# Patient Record
Sex: Male | Born: 1970 | Race: White | Marital: Married | Smoking: Never smoker
Health system: Southern US, Community
[De-identification: ages and names within clinical notes are randomized; demographics above are authoritative.]

## PROBLEM LIST (undated history)

## (undated) DIAGNOSIS — C439 Malignant melanoma of skin, unspecified: Secondary | ICD-10-CM

## (undated) DIAGNOSIS — R519 Headache, unspecified: Secondary | ICD-10-CM

## (undated) DIAGNOSIS — Z973 Presence of spectacles and contact lenses: Secondary | ICD-10-CM

## (undated) DIAGNOSIS — K219 Gastro-esophageal reflux disease without esophagitis: Secondary | ICD-10-CM

## (undated) DIAGNOSIS — Z87442 Personal history of urinary calculi: Secondary | ICD-10-CM

## (undated) DIAGNOSIS — J45909 Unspecified asthma, uncomplicated: Secondary | ICD-10-CM

## (undated) DIAGNOSIS — F419 Anxiety disorder, unspecified: Secondary | ICD-10-CM

## (undated) DIAGNOSIS — J189 Pneumonia, unspecified organism: Secondary | ICD-10-CM

## (undated) DIAGNOSIS — Z889 Allergy status to unspecified drugs, medicaments and biological substances status: Secondary | ICD-10-CM

## (undated) DIAGNOSIS — R51 Headache: Secondary | ICD-10-CM

## (undated) HISTORY — PX: ROTATOR CUFF REPAIR: SHX139

## (undated) HISTORY — PX: HERNIA REPAIR: SHX51

## (undated) HISTORY — PX: KNEE ARTHROSCOPY: SHX127

---

## 2015-10-15 ENCOUNTER — Other Ambulatory Visit: Payer: Self-pay | Admitting: Orthopaedic Surgery

## 2015-10-15 DIAGNOSIS — M25562 Pain in left knee: Secondary | ICD-10-CM

## 2015-10-22 ENCOUNTER — Ambulatory Visit
Admission: RE | Admit: 2015-10-22 | Discharge: 2015-10-22 | Disposition: A | Discharge status: Home / Self Care | Payer: BLUE CROSS/BLUE SHIELD | Source: Ambulatory Visit | Attending: Orthopaedic Surgery | Admitting: Orthopaedic Surgery

## 2015-10-22 DIAGNOSIS — M25562 Pain in left knee: Secondary | ICD-10-CM

## 2015-11-21 ENCOUNTER — Other Ambulatory Visit: Payer: Self-pay | Admitting: Orthopedic Surgery

## 2015-12-03 ENCOUNTER — Encounter (HOSPITAL_BASED_OUTPATIENT_CLINIC_OR_DEPARTMENT_OTHER): Payer: Self-pay | Admitting: *Deleted

## 2015-12-10 ENCOUNTER — Ambulatory Visit (HOSPITAL_BASED_OUTPATIENT_CLINIC_OR_DEPARTMENT_OTHER): Payer: BLUE CROSS/BLUE SHIELD | Admitting: Anesthesiology

## 2015-12-10 ENCOUNTER — Encounter (HOSPITAL_BASED_OUTPATIENT_CLINIC_OR_DEPARTMENT_OTHER): Payer: Self-pay | Admitting: Anesthesiology

## 2015-12-10 ENCOUNTER — Encounter (HOSPITAL_BASED_OUTPATIENT_CLINIC_OR_DEPARTMENT_OTHER)
Admission: RE | Disposition: A | Discharge status: Home / Self Care | Payer: Self-pay | Source: Ambulatory Visit | Attending: Orthopedic Surgery

## 2015-12-10 ENCOUNTER — Ambulatory Visit (HOSPITAL_BASED_OUTPATIENT_CLINIC_OR_DEPARTMENT_OTHER)
Admission: RE | Admit: 2015-12-10 | Discharge: 2015-12-10 | Disposition: A | Discharge status: Home / Self Care | Payer: BLUE CROSS/BLUE SHIELD | Source: Ambulatory Visit | Attending: Orthopedic Surgery | Admitting: Orthopedic Surgery

## 2015-12-10 DIAGNOSIS — J45909 Unspecified asthma, uncomplicated: Secondary | ICD-10-CM | POA: Diagnosis not present

## 2015-12-10 DIAGNOSIS — F419 Anxiety disorder, unspecified: Secondary | ICD-10-CM | POA: Insufficient documentation

## 2015-12-10 DIAGNOSIS — S43431A Superior glenoid labrum lesion of right shoulder, initial encounter: Secondary | ICD-10-CM | POA: Diagnosis not present

## 2015-12-10 DIAGNOSIS — X58XXXA Exposure to other specified factors, initial encounter: Secondary | ICD-10-CM | POA: Insufficient documentation

## 2015-12-10 DIAGNOSIS — M75111 Incomplete rotator cuff tear or rupture of right shoulder, not specified as traumatic: Secondary | ICD-10-CM | POA: Diagnosis not present

## 2015-12-10 HISTORY — DX: Unspecified asthma, uncomplicated: J45.909

## 2015-12-10 HISTORY — DX: Anxiety disorder, unspecified: F41.9

## 2015-12-10 HISTORY — PX: SHOULDER ARTHROSCOPY WITH SUBACROMIAL DECOMPRESSION: SHX5684

## 2015-12-10 SURGERY — SHOULDER ARTHROSCOPY WITH SUBACROMIAL DECOMPRESSION
Anesthesia: General | Site: Shoulder | Laterality: Right

## 2015-12-10 MED ORDER — DOCUSATE SODIUM 100 MG PO CAPS: 100.0000 mg | ORAL_CAPSULE | Freq: Three times a day (TID) | ORAL | 0 refills | Status: AC | PRN

## 2015-12-10 MED ORDER — OXYCODONE-ACETAMINOPHEN 5-325 MG PO TABS: 1.0000 | ORAL_TABLET | ORAL | 0 refills | Status: AC | PRN

## 2015-12-10 MED ORDER — SCOPOLAMINE 1 MG/3DAYS TD PT72: 1.0000 | MEDICATED_PATCH | Freq: Once | TRANSDERMAL | Status: DC | PRN

## 2015-12-10 MED ORDER — DEXAMETHASONE SODIUM PHOSPHATE 10 MG/ML IJ SOLN
INTRAMUSCULAR | Status: AC
  Filled 2015-12-10: qty 1

## 2015-12-10 MED ORDER — PROPOFOL 10 MG/ML IV BOLUS
INTRAVENOUS | Status: DC | PRN
  Administered 2015-12-10: 200 mg via INTRAVENOUS

## 2015-12-10 MED ORDER — GLYCOPYRROLATE 0.2 MG/ML IJ SOLN: 0.2000 mg | Freq: Once | INTRAMUSCULAR | Status: DC | PRN

## 2015-12-10 MED ORDER — BUPIVACAINE-EPINEPHRINE (PF) 0.5% -1:200000 IJ SOLN
INTRAMUSCULAR | Status: DC | PRN
  Administered 2015-12-10: 25 mL via PERINEURAL

## 2015-12-10 MED ORDER — FENTANYL CITRATE (PF) 100 MCG/2ML IJ SOLN
50.0000 ug | INTRAMUSCULAR | Status: DC | PRN
  Administered 2015-12-10: 100 ug via INTRAVENOUS
  Administered 2015-12-10: 50 ug via INTRAVENOUS

## 2015-12-10 MED ORDER — ONDANSETRON HCL 4 MG/2ML IJ SOLN
INTRAMUSCULAR | Status: AC
  Filled 2015-12-10: qty 2

## 2015-12-10 MED ORDER — BUPIVACAINE HCL (PF) 0.5 % IJ SOLN
INTRAMUSCULAR | Status: AC
  Filled 2015-12-10: qty 30

## 2015-12-10 MED ORDER — SUCCINYLCHOLINE CHLORIDE 20 MG/ML IJ SOLN
INTRAMUSCULAR | Status: DC | PRN
  Administered 2015-12-10: 100 mg via INTRAVENOUS

## 2015-12-10 MED ORDER — LACTATED RINGERS IV SOLN
INTRAVENOUS | Status: DC
  Administered 2015-12-10: 10:00:00 via INTRAVENOUS

## 2015-12-10 MED ORDER — DEXAMETHASONE SODIUM PHOSPHATE 4 MG/ML IJ SOLN
INTRAMUSCULAR | Status: DC | PRN
Start: 2015-12-10 — End: 2015-12-10
  Administered 2015-12-10: 10 mg via INTRAVENOUS

## 2015-12-10 MED ORDER — FENTANYL CITRATE (PF) 100 MCG/2ML IJ SOLN
INTRAMUSCULAR | Status: AC
  Filled 2015-12-10: qty 2

## 2015-12-10 MED ORDER — MIDAZOLAM HCL 2 MG/2ML IJ SOLN
1.0000 mg | INTRAMUSCULAR | Status: DC | PRN
  Administered 2015-12-10: 2 mg via INTRAVENOUS

## 2015-12-10 MED ORDER — SUCCINYLCHOLINE CHLORIDE 200 MG/10ML IV SOSY
PREFILLED_SYRINGE | INTRAVENOUS | Status: AC
  Filled 2015-12-10: qty 10

## 2015-12-10 MED ORDER — LIDOCAINE HCL (CARDIAC) 20 MG/ML IV SOLN
INTRAVENOUS | Status: DC | PRN
  Administered 2015-12-10: 50 mg via INTRAVENOUS

## 2015-12-10 MED ORDER — POVIDONE-IODINE 7.5 % EX SOLN: Freq: Once | CUTANEOUS | Status: DC

## 2015-12-10 MED ORDER — CEFAZOLIN SODIUM-DEXTROSE 2-4 GM/100ML-% IV SOLN
2.0000 g | INTRAVENOUS | Status: AC
  Administered 2015-12-10: 2 g via INTRAVENOUS

## 2015-12-10 MED ORDER — CEFAZOLIN SODIUM-DEXTROSE 2-4 GM/100ML-% IV SOLN
INTRAVENOUS | Status: AC
  Filled 2015-12-10: qty 100

## 2015-12-10 MED ORDER — LIDOCAINE 2% (20 MG/ML) 5 ML SYRINGE
INTRAMUSCULAR | Status: AC
  Filled 2015-12-10: qty 5

## 2015-12-10 MED ORDER — MIDAZOLAM HCL 2 MG/2ML IJ SOLN
INTRAMUSCULAR | Status: AC
  Filled 2015-12-10: qty 2

## 2015-12-10 MED ORDER — SODIUM CHLORIDE 0.9 % IR SOLN
Status: DC | PRN
  Administered 2015-12-10: 6000 mL

## 2015-12-10 SURGICAL SUPPLY — 84 items
BENZOIN TINCTURE PRP APPL 2/3 (GAUZE/BANDAGES/DRESSINGS) ×2 IMPLANT
BLADE CLIPPER SURG (BLADE) IMPLANT
BLADE SURG 15 STRL LF DISP TIS (BLADE) IMPLANT
BLADE SURG 15 STRL SS (BLADE)
BLADE VORTEX 6.0 (BLADE) IMPLANT
BUR OVAL 4.0 (BURR) ×2 IMPLANT
CANNULA 5.75X71 LONG (CANNULA) ×2 IMPLANT
CANNULA TWIST IN 8.25X7CM (CANNULA) IMPLANT
CHLORAPREP W/TINT 26ML (MISCELLANEOUS) ×2 IMPLANT
CLSR STERI-STRIP ANTIMIC 1/2X4 (GAUZE/BANDAGES/DRESSINGS) ×2 IMPLANT
DECANTER SPIKE VIAL GLASS SM (MISCELLANEOUS) IMPLANT
DERMABOND ADVANCED (GAUZE/BANDAGES/DRESSINGS) ×1
DERMABOND ADVANCED .7 DNX12 (GAUZE/BANDAGES/DRESSINGS) ×1 IMPLANT
DRAPE IMP U-DRAPE 54X76 (DRAPES) ×2 IMPLANT
DRAPE INCISE IOBAN 66X45 STRL (DRAPES) ×2 IMPLANT
DRAPE STERI 35X30 U-POUCH (DRAPES) ×2 IMPLANT
DRAPE SURG 17X23 STRL (DRAPES) ×2 IMPLANT
DRAPE U-SHAPE 47X51 STRL (DRAPES) ×2 IMPLANT
DRAPE U-SHAPE 76X120 STRL (DRAPES) ×4 IMPLANT
DRSG OPSITE 6X11 MED (GAUZE/BANDAGES/DRESSINGS) ×2 IMPLANT
DRSG PAD ABDOMINAL 8X10 ST (GAUZE/BANDAGES/DRESSINGS) ×2 IMPLANT
ELECT BLADE 4.0 EZ CLEAN MEGAD (MISCELLANEOUS)
ELECT REM PT RETURN 9FT ADLT (ELECTROSURGICAL) ×2
ELECTRODE BLDE 4.0 EZ CLN MEGD (MISCELLANEOUS) IMPLANT
ELECTRODE REM PT RTRN 9FT ADLT (ELECTROSURGICAL) ×1 IMPLANT
GAUZE SPONGE 4X4 12PLY STRL (GAUZE/BANDAGES/DRESSINGS) ×2 IMPLANT
GAUZE SPONGE 4X4 16PLY XRAY LF (GAUZE/BANDAGES/DRESSINGS) ×2 IMPLANT
GAUZE XEROFORM 1X8 LF (GAUZE/BANDAGES/DRESSINGS) ×2 IMPLANT
GLOVE BIO SURGEON STRL SZ7 (GLOVE) ×2 IMPLANT
GLOVE BIO SURGEON STRL SZ7.5 (GLOVE) ×2 IMPLANT
GLOVE BIOGEL PI IND STRL 7.0 (GLOVE) ×1 IMPLANT
GLOVE BIOGEL PI IND STRL 8 (GLOVE) ×1 IMPLANT
GLOVE BIOGEL PI INDICATOR 7.0 (GLOVE) ×1
GLOVE BIOGEL PI INDICATOR 8 (GLOVE) ×1
GOWN STRL REUS W/ TWL LRG LVL3 (GOWN DISPOSABLE) ×2 IMPLANT
GOWN STRL REUS W/TWL LRG LVL3 (GOWN DISPOSABLE) ×2
GOWN STRL REUS W/TWL XL LVL3 (GOWN DISPOSABLE) ×2 IMPLANT
LASSO CRESCENT QUICKPASS (SUTURE) IMPLANT
MANIFOLD NEPTUNE II (INSTRUMENTS) ×2 IMPLANT
NDL SUT 6 .5 CRC .975X.05 MAYO (NEEDLE) IMPLANT
NEEDLE 1/2 CIR CATGUT .05X1.09 (NEEDLE) IMPLANT
NEEDLE MAYO TAPER (NEEDLE)
NEEDLE SCORPION MULTI FIRE (NEEDLE) IMPLANT
NS IRRIG 1000ML POUR BTL (IV SOLUTION) IMPLANT
PACK ARTHROSCOPY DSU (CUSTOM PROCEDURE TRAY) ×2 IMPLANT
PACK BASIN DAY SURGERY FS (CUSTOM PROCEDURE TRAY) ×2 IMPLANT
PENCIL BUTTON HOLSTER BLD 10FT (ELECTRODE) IMPLANT
PROBE BIPOLAR ATHRO 135MM 90D (MISCELLANEOUS) ×2 IMPLANT
RESECTOR FULL RADIUS 4.2MM (BLADE) ×2 IMPLANT
SLEEVE SCD COMPRESS KNEE MED (MISCELLANEOUS) ×2 IMPLANT
SLING ARM FOAM STRAP LRG (SOFTGOODS) ×2 IMPLANT
SLING ARM IMMOBILIZER MED (SOFTGOODS) IMPLANT
SLING ARM MED ADULT FOAM STRAP (SOFTGOODS) IMPLANT
SLING ARM XL FOAM STRAP (SOFTGOODS) IMPLANT
SPONGE LAP 4X18 X RAY DECT (DISPOSABLE) IMPLANT
STRIP CLOSURE SKIN 1/2X4 (GAUZE/BANDAGES/DRESSINGS) ×2 IMPLANT
SUCTION FRAZIER HANDLE 10FR (MISCELLANEOUS)
SUCTION TUBE FRAZIER 10FR DISP (MISCELLANEOUS) IMPLANT
SUPPORT WRAP ARM LG (MISCELLANEOUS) IMPLANT
SUT 2 FIBERLOOP 20 STRT BLUE (SUTURE)
SUT BONE WAX W31G (SUTURE) IMPLANT
SUT ETHIBOND 2 OS 4 DA (SUTURE) IMPLANT
SUT ETHILON 3 0 PS 1 (SUTURE) ×2 IMPLANT
SUT ETHILON 4 0 PS 2 18 (SUTURE) IMPLANT
SUT FIBERWIRE #2 38 T-5 BLUE (SUTURE)
SUT MNCRL AB 3-0 PS2 18 (SUTURE) IMPLANT
SUT MNCRL AB 4-0 PS2 18 (SUTURE) IMPLANT
SUT PDS AB 0 CT 36 (SUTURE) IMPLANT
SUT PROLENE 3 0 PS 2 (SUTURE) IMPLANT
SUT TIGER TAPE 7 IN WHITE (SUTURE) IMPLANT
SUT VIC AB 0 CT1 27 (SUTURE)
SUT VIC AB 0 CT1 27XBRD ANBCTR (SUTURE) IMPLANT
SUT VIC AB 2-0 SH 27 (SUTURE)
SUT VIC AB 2-0 SH 27XBRD (SUTURE) IMPLANT
SUTURE 2 FIBERLOOP 20 STRT BLU (SUTURE) IMPLANT
SUTURE FIBERWR #2 38 T-5 BLUE (SUTURE) IMPLANT
SYR BULB 3OZ (MISCELLANEOUS) IMPLANT
TAPE FIBER 2MM 7IN #2 BLUE (SUTURE) IMPLANT
TOWEL OR 17X24 6PK STRL BLUE (TOWEL DISPOSABLE) ×2 IMPLANT
TOWEL OR NON WOVEN STRL DISP B (DISPOSABLE) ×2 IMPLANT
TUBE CONNECTING 20X1/4 (TUBING) ×2 IMPLANT
TUBING ARTHROSCOPY IRRIG 16FT (MISCELLANEOUS) ×2 IMPLANT
WATER STERILE IRR 1000ML POUR (IV SOLUTION) ×2 IMPLANT
YANKAUER SUCT BULB TIP NO VENT (SUCTIONS) IMPLANT

## 2015-12-10 NOTE — Transfer of Care (Signed)
Immediate Anesthesia Transfer of Care Note  Patient: Jerry Ayala  Procedure(s) Performed: Procedure(s) with comments: SHOULDER ARTHROSCOPY ROTATOR CUFF DEBRIDEMENT VS REPAIR WITH SUBACROMIAL DECOMPRESSION, DISTAL CLAVICAL RESECTION (Right) - SHOULDER ARTHROSCOPY ROTATOR CUFF DEBRIDEMENT VS REPAIR WITH SUBACROMIAL DECOMPRESSION, DISTAL CLAVICAL EXCISION, POSSIBLE BICEPS TENODESIS  Patient Location: PACU  Anesthesia Type:General  Level of Consciousness: awake and sedated  Airway & Oxygen Therapy: Patient Spontanous Breathing and Patient connected to face mask oxygen  Post-op Assessment: Report given to RN and Post -op Vital signs reviewed and stable  Post vital signs: Reviewed and stable  Last Vitals:  Vitals:   12/10/15 1304 12/10/15 1305  BP:  131/81  Pulse: (!) 112 (!) 106  Resp:  16  Temp:      Last Pain:  Vitals:   12/10/15 0924  TempSrc: Oral  PainSc: 1       Patients Stated Pain Goal: 1 ([id number])  Complications: No apparent anesthesia complications

## 2015-12-10 NOTE — Anesthesia Procedure Notes (Signed)
Procedure Name: Intubation Performed by: Terrance Mass Pre-anesthesia Checklist: Patient identified, Emergency Drugs available, Suction available and Patient being monitored Patient Re-evaluated:Patient Re-evaluated prior to inductionOxygen Delivery Method: Circle system utilized Preoxygenation: Pre-oxygenation with 100% oxygen Intubation Type: IV induction Ventilation: Mask ventilation without difficulty Laryngoscope Size: Miller and 2 Grade View: Grade I Tube type: Oral Tube size: 7.0 mm Number of attempts: 1 Airway Equipment and Method: Stylet and Oral airway Placement Confirmation: ETT inserted through vocal cords under direct vision,  positive ETCO2 and breath sounds checked- equal and bilateral Secured at: 22 cm Tube secured with: Tape Dental Injury: Teeth and Oropharynx as per pre-operative assessment

## 2015-12-10 NOTE — H&P (Signed)
Jerry Ayala is an 45 y.o. male.   Chief Complaint: R shoulder pain HPI: R shoulder pain x 18 mo, failed extensive conservative management, injections, therapy, NSAIDs.   Past Medical History:  Diagnosis Date  . Anxiety   . Asthma     Past Surgical History:  Procedure Laterality Date  . KNEE ARTHROSCOPY    . ROTATOR CUFF REPAIR      History reviewed. No pertinent family history. Social History:  reports that he has never smoked. He has never used smokeless tobacco. He reports that he does not drink alcohol or use drugs.  Allergies: No Known Allergies  Medications Prior to Admission  Medication Sig Dispense Refill  . albuterol-ipratropium (COMBIVENT) 18-103 MCG/ACT inhaler Inhale 1 puff into the lungs every 4 (four) hours.    . busPIRone (BUSPAR) 5 MG tablet Take 5 mg by mouth 3 (three) times daily.    Marland Kitchen omeprazole (PRILOSEC) 20 MG capsule Take 20 mg by mouth daily.    . Triamcinolone Acetonide (NASACORT ALLERGY 24HR NA) Place into the nose.      No results found for this or any previous visit (from the past 48 hour(s)). No results found.  Review of Systems  All other systems reviewed and are negative.   Blood pressure 134/82, pulse 80, temperature 97.8 F (36.6 C), temperature source Oral, resp. rate (!) 9, height 5\' 10"  (1.778 m), weight 89.8 kg (198 lb), SpO2 100 %. Physical Exam  Constitutional: He is oriented to person, place, and time. He appears well-developed and well-nourished.  HENT:  Head: Atraumatic.  Eyes: EOM are normal.  Cardiovascular: Intact distal pulses.   Respiratory: Effort normal.  Musculoskeletal:  R shoulder pain with RC testing. TTP at [facility] joint.  Neurological: He is alert and oriented to person, place, and time.  Skin: Skin is warm and dry.  Psychiatric: He has a normal mood and affect.     Assessment/Plan R shoulder pain x 18 mo, failed extensive conservative management, injections, therapy, NSAIDs.  Plan R arth RC debride vs repair, SAD,  DCE Risks / benefits of surgery discussed Consent on chart  NPO for OR Preop antibiotics   Nita Sells, MD 12/10/2015, 11:25 AM

## 2015-12-10 NOTE — Anesthesia Preprocedure Evaluation (Signed)
Anesthesia Evaluation  Patient identified by MRN, date of birth, ID band Patient awake    Reviewed: Allergy & Precautions, NPO status , Patient's Chart, lab work & pertinent test results  Airway Mallampati: I  TM Distance: >3 FB Neck ROM: Full    Dental  (+) Teeth Intact, Dental Advisory Given   Pulmonary asthma ,    breath sounds clear to auscultation       Cardiovascular  Rhythm:Regular Rate:Normal     Neuro/Psych    GI/Hepatic   Endo/Other    Renal/GU      Musculoskeletal   Abdominal   Peds  Hematology   Anesthesia Other Findings   Reproductive/Obstetrics                             Anesthesia Physical Anesthesia Plan  ASA: II  Anesthesia Plan: General   Post-op Pain Management: GA combined w/ Regional for post-op pain   Induction: Intravenous  Airway Management Planned: Oral ETT  Additional Equipment:   Intra-op Plan:   Post-operative Plan: Extubation in OR  Informed Consent: I have reviewed the patients History and Physical, chart, labs and discussed the procedure including the risks, benefits and alternatives for the proposed anesthesia with the patient or authorized representative who has indicated his/her understanding and acceptance.   Dental advisory given  Plan Discussed with: CRNA, Anesthesiologist and Surgeon  Anesthesia Plan Comments:         Anesthesia Quick Evaluation

## 2015-12-10 NOTE — Anesthesia Postprocedure Evaluation (Signed)
Anesthesia Post Note  Patient: Jerry Ayala  Procedure(s) Performed: Procedure(s) (LRB): SHOULDER ARTHROSCOPY ROTATOR CUFF DEBRIDEMENT VS REPAIR WITH SUBACROMIAL DECOMPRESSION, DISTAL CLAVICAL RESECTION (Right)  Patient location during evaluation: PACU Anesthesia Type: General Level of consciousness: awake and alert Pain management: pain level controlled Vital Signs Assessment: post-procedure vital signs reviewed and stable Respiratory status: spontaneous breathing, nonlabored ventilation and respiratory function stable Cardiovascular status: blood pressure returned to baseline and stable Postop Assessment: no signs of nausea or vomiting Anesthetic complications: no    Last Vitals:  Vitals:   12/10/15 1330 12/10/15 1345  BP: 135/84 131/78  Pulse: 92 93  Resp: 15 12  Temp:      Last Pain:  Vitals:   12/10/15 1330  TempSrc:   PainSc: 0-No pain                 Christipher Rieger A

## 2015-12-10 NOTE — Discharge Instructions (Signed)
Discharge Instructions after Arthroscopic Shoulder Surgery ° ° °A sling has been provided for you. You may remove the sling after 72 hours. The sling may be worn for your protection, if you are in a crowd.  °Use ice on the shoulder intermittently over the first 48 hours after surgery.  °Pain medication has been prescribed for you.  °Use your medication liberally over the first 48 hours, and then begin to taper your use. You may take Extra Strength Tylenol or Tylenol only in place of the pain pills. DO NOT take ANY nonsteroidal anti-inflammatory pain medications: Advil, Motrin, Ibuprofen, Aleve, Naproxen, or Naprosyn.  °You may remove your dressing after two days.  °You may shower 5 days after surgery. The incision CANNOT get wet prior to 5 days. Simply allow the water to wash over the site and then pat dry. Do not rub the incision. Make sure your axilla (armpit) is completely dry after showering.  °Take one aspirin a day for 2 weeks after surgery, unless you have an aspirin sensitivity/allergy or asthma.  °Three to 5 times each day you should perform assisted overhead reaching and external rotation (outward turning) exercises with the operative arm. Both exercises should be done with the non-operative arm used as the "therapist arm" while the operative arm remains relaxed. Ten of each exercise should be done three to five times each day. ° ° ° °Overhead reach is helping to lift your stiff arm up as high as it will go. To stretch your overhead reach, lie flat on your back, relax, and grasp the wrist of the tight shoulder with your opposite hand. Using the power in your opposite arm, bring the stiff arm up as far as it is comfortable. Start holding it for ten seconds and then work up to where you can hold it for a count of 30. Breathe slowly and deeply while the arm is moved. Repeat this stretch ten times, trying to help the arm up a little higher each time.  ° ° ° ° ° °External rotation is turning the arm out to  the side while your elbow stays close to your body. External rotation is best stretched while you are lying on your back. Hold a cane, yardstick, broom handle, or dowel in both hands. Bend both elbows to a right angle. Use steady, gentle force from your normal arm to rotate the hand of the stiff shoulder out away from your body. Continue the rotation as far as it will go comfortably, holding it there for a count of 10. Repeat this exercise ten times.  ° ° ° °Please call [phone] during normal business hours or [phone] after hours for any problems. Including the following: ° °- excessive redness of the incisions °- drainage for more than 4 days °- fever of more than 101.5 F ° °*Please note that pain medications will not be refilled after hours or on weekends. ° ° °Regional Anesthesia Blocks ° °1. Numbness or the inability to move the "blocked" extremity may last from 3-48 hours after placement. The length of time depends on the medication injected and your individual response to the medication. If the numbness is not going away after 48 hours, call your surgeon. ° °2. The extremity that is blocked will need to be protected until the numbness is gone and the  Strength has returned. Because you cannot feel it, you will need to take extra care to avoid injury. Because it may be weak, you may have difficulty moving it or using   it. You may not know what position it is in without looking at it while the block is in effect. ° °3. For blocks in the legs and feet, returning to weight bearing and walking needs to be done carefully. You will need to wait until the numbness is entirely gone and the strength has returned. You should be able to move your leg and foot normally before you try and bear weight or walk. You will need someone to be with you when you first try to ensure you do not fall and possibly risk injury. ° °4. Bruising and tenderness at the needle site are common side effects and will resolve in a few  days. ° °5. Persistent numbness or new problems with movement should be communicated to the surgeon or the [facility] ([phone])/ [facility] ([phone]). ° ° °Post Anesthesia Home Care Instructions ° °Activity: °Get plenty of rest for the remainder of the day. A responsible adult should stay with you for 24 hours following the procedure.  °For the next 24 hours, DO NOT: °-Drive a car °-Operate machinery °-Drink alcoholic beverages °-Take any medication unless instructed by your physician °-Make any legal decisions or sign important papers. ° °Meals: °Start with liquid foods such as gelatin or soup. Progress to regular foods as tolerated. Avoid greasy, spicy, heavy foods. If nausea and/or vomiting occur, drink only clear liquids until the nausea and/or vomiting subsides. Call your physician if vomiting continues. ° °Special Instructions/Symptoms: °Your throat may feel dry or sore from the anesthesia or the breathing tube placed in your throat during surgery. If this causes discomfort, gargle with warm salt water. The discomfort should disappear within 24 hours. ° °If you had a scopolamine patch placed behind your ear for the management of post- operative nausea and/or vomiting: ° °1. The medication in the patch is effective for 72 hours, after which it should be removed.  Wrap patch in a tissue and discard in the trash. Wash hands thoroughly with soap and water. °2. You may remove the patch earlier than 72 hours if you experience unpleasant side effects which may include dry mouth, dizziness or visual disturbances. °3. Avoid touching the patch. Wash your hands with soap and water after contact with the patch. °  ° °

## 2015-12-10 NOTE — Anesthesia Procedure Notes (Addendum)
Anesthesia Regional Block:  Interscalene brachial plexus block  Pre-Anesthetic Checklist: ,, timeout performed, Correct Patient, Correct Site, Correct Laterality, Correct Procedure, Correct Position, site marked, Risks and benefits discussed,  Surgical consent,  Pre-op evaluation,  At surgeon's request and post-op pain management  Laterality: Right and Upper  Prep: chloraprep       Needles:  Injection technique: Single-shot  Needle Type: Echogenic Needle     Needle Length: 5cm 5 cm Needle Gauge: 21 and 21 G    Additional Needles:  Procedures: ultrasound guided (picture in chart) Interscalene brachial plexus block Narrative:  Start time: 12/10/2015 10:29 AM End time: 12/10/2015 10:34 AM Injection made incrementally with aspirations every 5 mL.  Performed by: Personally  Anesthesiologist: Adessa Primiano       Right Interscalene block image

## 2015-12-10 NOTE — Progress Notes (Signed)
Assisted Dr. Crews with right, ultrasound guided, interscalene  block. Side rails up, monitors on throughout procedure. See vital signs in flow sheet. Tolerated Procedure well. 

## 2015-12-10 NOTE — Op Note (Signed)
Procedure(s):  Procedure Note  Dondrell Bayles male 45 y.o. 12/10/2015  Procedure(s) and Anesthesia Type: #1 arthroscopic right shoulder debridement partial-thickness rotator cuff tear, type I SLAP tear #2 arthroscopic right shoulder subacromial decompression #3 right shoulder arthroscopic distal clavicle excision  Surgeon(s) and Role:    * Tania Ade, MD - Primary     Surgeon: Nita Sells   Assistants: Jeanmarie Hubert PA-C (Danielle was present and scrubbed throughout the procedure and was essential in positioning, assisting with the camera and instrumentation,, and closure)  Anesthesia: General endotracheal anesthesia with preoperative interscalene block given by the attending anesthesiologist    Procedure Detail    Estimated Blood Loss: Min         Drains: none  Blood Given: none         Specimens: none        Complications:  * No complications entered in OR log *         Disposition: PACU - hemodynamically stable.         Condition: stable    Procedure:   INDICATIONS FOR SURGERY: The patient is 45 y.o. male who has had a long history of right shoulder pain treated elsewhere with injections, activity modification, therapy and NSAIDs. He has failed extensive conservative management continues to have pain consistent with impingement/rotator cuff tearing and acromioclavicular joint arthropathy. Indicated for surgical treatment to try and decrease pain and restore function.  OPERATIVE FINDINGS: Examination under anesthesia: No significant stiffness or instability   DESCRIPTION OF PROCEDURE: The patient was identified in preoperative  holding area where I personally marked the operative site after  verifying site, side, and procedure with the patient. An interscalene block was given by the attending anesthesiologist the holding area.  The patient was taken back to the operating room where general anesthesia was induced without complication and was  placed in the beach-chair position with the back  elevated about 60 degrees and all extremities and head and neck carefully padded and  positioned.   The right upper extremity was then prepped and  draped in a standard sterile fashion. The appropriate time-out  procedure was carried out. The patient did receive IV antibiotics  within 30 minutes of incision.   A small posterior portal incision was made and the arthroscope was introduced into the joint. An anterior portal was then established above the subscapularis using needle localization. Small cannula was placed anteriorly. Diagnostic arthroscopy was then carried out.  He was initially noted to have partial tearing of the inner border of the subscapularis involving about 10% of the thickness of the tendon. This was debrided back to healthy bleeding tendon. The majority the tendon was intact and no formal repair was necessary. The biceps tendon was intact but there was extensive tearing of the superior labrum extending down anteriorly. This was debridement extensively with a shaver back to healthy labrum. There was a little bit of undercutting into the biceps anchor but I did not feel this was enough to justify biceps tenotomy and tenodesis. The biceps tendon itself looked very good. Cranial humeral joint surfaces were intact without chondromalacia. No loose bodies were noted. Supraspinatus was noted to have partial-thickness tearing but this only involved about 20-25% of the thickness of the tendon. This was extensively debrided with a shaver back to good healthy bleeding tendon to promote healing. I did not feel that this amount of partial tearing warranted formal repair. The infraspinatus was intact.  The arthroscope was then introduced into the subacromial space  a standard lateral portal was established with needle localization. The shaver was used through the lateral portal to perform extensive bursectomy. Coracoacromial ligament was examined and  found to be frayed indicating impingement.  Bursal surface of the rotator cuff had a small frayed area anteriorly in the region of impingement which is debrided. No partial tearing noted.  The coracoacromial ligament was taken down off the anterior acromion with the ArthroCare exposing a moderate hooked anterior acromial spur. A high-speed bur was then used through the lateral portal to take down the anterior acromial spur from lateral to medial in a standard acromioplasty.  The acromioplasty was also viewed from the lateral portal and the bur was used as necessary to ensure that the acromion was completely flat from posterior to anterior.  The distal clavicle was exposed arthroscopically and the bur was used to take off the undersurface for approximately 8 mm from the lateral portal. The bur was then moved to an anterior portal position to complete the distal clavicle excision resecting about 8 mm of the distal clavicle and a smooth even fashion. This was viewed from anterior and lateral portals and felt to be complete.  The arthroscopic equipment was removed from the joint and the portals were closed with 3-0 nylon in an interrupted fashion. Sterile dressings were then applied including Xeroform 4 x 4's ABDs and tape. The patient was then allowed to awaken from general anesthesia, placed in a sling, transferred to the stretcher and taken to the recovery room in stable condition.   POSTOPERATIVE PLAN: The patient will be discharged home today and will followup in one week for suture removal and wound check.  We will get him back in a therapy right away.

## 2015-12-11 ENCOUNTER — Encounter (HOSPITAL_BASED_OUTPATIENT_CLINIC_OR_DEPARTMENT_OTHER): Payer: Self-pay | Admitting: Orthopedic Surgery

## 2015-12-27 ENCOUNTER — Ambulatory Visit (INDEPENDENT_AMBULATORY_CARE_PROVIDER_SITE_OTHER): Payer: Self-pay | Admitting: Orthopaedic Surgery

## 2016-01-03 ENCOUNTER — Ambulatory Visit (INDEPENDENT_AMBULATORY_CARE_PROVIDER_SITE_OTHER): Payer: BLUE CROSS/BLUE SHIELD | Admitting: Orthopaedic Surgery

## 2016-01-03 DIAGNOSIS — M25562 Pain in left knee: Secondary | ICD-10-CM

## 2017-04-06 ENCOUNTER — Ambulatory Visit: Payer: Self-pay | Admitting: Surgery

## 2017-04-06 DIAGNOSIS — C439 Malignant melanoma of skin, unspecified: Secondary | ICD-10-CM

## 2017-04-06 NOTE — H&P (Signed)
Jerry Ayala Documented: 04/06/2017 9:25 AM Location: [facility] Patient #: [id number] DOB: 1970-12-20 Married / Language: [name] / Race: White Male  History of Present Illness Jerry Ayala; 04/06/2017 11:05 AM) Patient words: Patient sent at the request of Dr. Lissa Merlin for right lateral thigh melanoma. The patient has had close follow-up with dermatology prior to moving to the area. There was an atypical skin lesion removed from his right thigh. 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 There is no ulceration. He gives no history of bleeding from the area prior to biopsy.  The patient is a 46 year old male.   Past Surgical History (Tanisha A. Owens Shark, [place]; 04/06/2017 9:25 AM) Knee Surgery Bilateral. Shoulder Surgery Bilateral.  Diagnostic Studies History (Tanisha A. Owens Shark, [place]; 04/06/2017 9:25 AM) Colonoscopy never  Allergies (Tanisha A. Owens Shark, [place]; 04/06/2017 9:26 AM) No Known Drug Allergies [04/06/2017]: Allergies Reconciled  Medication History (Tanisha A. Owens Shark, RMA; 04/06/2017 9:26 AM) Zoloft (50MG  Tablet, Oral) Active. Omeprazole (20MG  Capsule DR, Oral) Active. Medications Reconciled  Social History (Tanisha A. Owens Shark, [place]; 04/06/2017 9:25 AM) Caffeine use Carbonated beverages, Coffee, Tea. No alcohol use No drug use Tobacco use Never smoker.  Family History (Tanisha A. Owens Shark, [place]; 04/06/2017 9:25 AM) Diabetes Mellitus Father. Heart Disease Father. Hypertension Father. Prostate Cancer Father.  Other Problems (Tanisha A. Owens Shark, [place]; 04/06/2017 9:25 AM) Anxiety Disorder Asthma Back Pain Gastroesophageal Reflux Disease Kidney Stone Melanoma     Review of Systems (Tanisha A. Brown RMA; 04/06/2017 9:25 AM) General Not Present- Appetite Loss, Chills, Fatigue, Fever, Night Sweats, Weight Gain and Weight Loss. Skin Present- Change in Wart/Mole. Not Present- Dryness, Hives, Jaundice, New  Lesions, Non-Healing Wounds, Rash and Ulcer. HEENT Present- Seasonal Allergies and Wears glasses/contact lenses. Not Present- Earache, Hearing Loss, Hoarseness, Nose Bleed, Oral Ulcers, Ringing in the Ears, Sinus Pain, Sore Throat, Visual Disturbances and Yellow Eyes. Respiratory Present- Snoring. Not Present- Bloody sputum, Chronic Cough, Difficulty Breathing and Wheezing. Breast Not Present- Breast Mass, Breast Pain, Nipple Discharge and Skin Changes. Cardiovascular Not Present- Chest Pain, Difficulty Breathing Lying Down, Leg Cramps, Palpitations, Rapid Heart Rate, Shortness of Breath and Swelling of Extremities. Gastrointestinal Not Present- Abdominal Pain, Bloating, Bloody Stool, Change in Bowel Habits, Chronic diarrhea, Constipation, Difficulty Swallowing, Excessive gas, Gets full quickly at meals, Hemorrhoids, Indigestion, Nausea, Rectal Pain and Vomiting. Male Genitourinary Not Present- Blood in Urine, Change in Urinary Stream, Frequency, Impotence, Nocturia, Painful Urination, Urgency and Urine Leakage. Musculoskeletal Present- Back Pain. Not Present- Joint Pain, Joint Stiffness, Muscle Pain, Muscle Weakness and Swelling of Extremities. Neurological Not Present- Decreased Memory, Fainting, Headaches, Numbness, Seizures, Tingling, Tremor, Trouble walking and Weakness. Psychiatric Present- Anxiety. Not Present- Bipolar, Change in Sleep Pattern, Depression, Fearful and Frequent crying. Endocrine Not Present- Cold Intolerance, Excessive Hunger, Hair Changes, Heat Intolerance, Hot flashes and New Diabetes. Hematology Not Present- Blood Thinners, Easy Bruising, Excessive bleeding, Gland problems, HIV and Persistent Infections.  Vitals (Tanisha A. Brown RMA; 04/06/2017 9:26 AM) 04/06/2017 9:25 AM Weight: 201.8 lb Height: 70in Body Surface Area: 2.1 m Body Mass Index: 28.95 kg/m  Temp.: 98.59F  Pulse: 78 (Regular)  BP: 122/86 (Sitting, Left Arm, Standard)      Physical Exam  (Dorotea Hand A. Vinh Sachs Ayala; 04/06/2017 11:06 AM)  General Mental Status-Alert. General Appearance-Consistent with stated age. Hydration-Well hydrated. Voice-Normal.  Integumentary Note: One similar open wound right lateral thigh. No evidence of satellite lesions.  Eye Eyeball - Bilateral-Extraocular movements intact. Sclera/Conjunctiva -  Bilateral-No scleral icterus.  Neurologic Neurologic evaluation reveals -alert and oriented x 3 with no impairment of recent or remote memory. Mental Status-Normal.  Musculoskeletal Normal Exam - Left-Upper Extremity Strength Normal and Lower Extremity Strength Normal. Normal Exam - Right-Upper Extremity Strength Normal and Lower Extremity Strength Normal.  Lymphatic Femoral & Inguinal  Generalized Femoral & Inguinal Lymphatics: Bilateral - Description - Normal. Tenderness - Non Tender.    Assessment & Plan (Fairley Copher A. Takoda Siedlecki Ayala; 04/06/2017 11:07 AM)  MELANOMA (C43.9) Impression: Discussed treatment options and recommended a wide local excision of his right thigh melanoma. Discussed a 1 cm margin which is the guideline currently for any melanoma less than 1 mm. Discuss sentinel lymph node mapping with him as well given the thickness was more than 8/10 mm. Risks, benefits and other options discussed. Potential long-term outcomes discussed. Wound complications and operative complications discussed. Risk of bleeding, infection, wound breakdown, large scar lymphedema, nerve injury, blood vessel injury need further operations and/or procedures and/or treatments discussed with the patient. Long-term expectations recovery discussed.  Current Plans Pt Education - Melanoma: cancer You are being scheduled for surgery- Our schedulers will call you.  You should hear from our office's scheduling department within 5 working days about the location, date, and time of surgery. We try to make accommodations for patient's preferences in  scheduling surgery, but sometimes the OR schedule or the surgeon's schedule prevents [place] from making those accommodations.  If you have not heard from our office [phone]) in 5 working days, call the office and ask for your surgeon's nurse.  If you have other questions about your diagnosis, plan, or surgery, call the office and ask for your surgeon's nurse.

## 2017-04-06 NOTE — H&P (View-Only) (Signed)
Jerry Ayala Documented: 04/06/2017 9:25 AM Location: [facility] Patient #: [id number] DOB: [birth year] Married / Language: [name] / Race: White Male  History of Present Illness Jerry Moores A. Camilo Mander MD; 04/06/2017 11:05 AM) Patient words: Patient sent at the request of Dr. Lissa Merlin for right lateral thigh melanoma. The patient has had close follow-up with dermatology prior to moving to the area. There was an atypical skin lesion removed from his right thigh. 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 There is no ulceration. He gives no history of bleeding from the area prior to biopsy.  The patient is a 47 year old male.   Past Surgical History (Tanisha A. Owens Shark, [place]; 04/06/2017 9:25 AM) Knee Surgery Bilateral. Shoulder Surgery Bilateral.  Diagnostic Studies History (Tanisha A. Owens Shark, [place]; 04/06/2017 9:25 AM) Colonoscopy never  Allergies (Tanisha A. Owens Shark, [place]; 04/06/2017 9:26 AM) No Known Drug Allergies [04/06/2017]: Allergies Reconciled  Medication History (Tanisha A. Owens Shark, RMA; 04/06/2017 9:26 AM) Zoloft (50MG  Tablet, Oral) Active. Omeprazole (20MG  Capsule DR, Oral) Active. Medications Reconciled  Social History (Tanisha A. Owens Shark, [place]; 04/06/2017 9:25 AM) Caffeine use Carbonated beverages, Coffee, Tea. No alcohol use No drug use Tobacco use Never smoker.  Family History (Tanisha A. Owens Shark, [place]; 04/06/2017 9:25 AM) Diabetes Mellitus Father. Heart Disease Father. Hypertension Father. Prostate Cancer Father.  Other Problems (Tanisha A. Owens Shark, [employer]; 04/06/2017 9:25 AM) Anxiety Disorder Asthma Back Pain Gastroesophageal Reflux Disease Kidney Stone Melanoma     Review of Systems (Tanisha A. Brown RMA; 04/06/2017 9:25 AM) General Not Present- Appetite Loss, Chills, Fatigue, Fever, Night Sweats, Weight Gain and Weight Loss. Skin Present- Change in Wart/Mole. Not Present- Dryness, Hives, Jaundice, New  Lesions, Non-Healing Wounds, Rash and Ulcer. HEENT Present- Seasonal Allergies and Wears glasses/contact lenses. Not Present- Earache, Hearing Loss, Hoarseness, Nose Bleed, Oral Ulcers, Ringing in the Ears, Sinus Pain, Sore Throat, Visual Disturbances and Yellow Eyes. Respiratory Present- Snoring. Not Present- Bloody sputum, Chronic Cough, Difficulty Breathing and Wheezing. Breast Not Present- Breast Mass, Breast Pain, Nipple Discharge and Skin Changes. Cardiovascular Not Present- Chest Pain, Difficulty Breathing Lying Down, Leg Cramps, Palpitations, Rapid Heart Rate, Shortness of Breath and Swelling of Extremities. Gastrointestinal Not Present- Abdominal Pain, Bloating, Bloody Stool, Change in Bowel Habits, Chronic diarrhea, Constipation, Difficulty Swallowing, Excessive gas, Gets full quickly at meals, Hemorrhoids, Indigestion, Nausea, Rectal Pain and Vomiting. Male Genitourinary Not Present- Blood in Urine, Change in Urinary Stream, Frequency, Impotence, Nocturia, Painful Urination, Urgency and Urine Leakage. Musculoskeletal Present- Back Pain. Not Present- Joint Pain, Joint Stiffness, Muscle Pain, Muscle Weakness and Swelling of Extremities. Neurological Not Present- Decreased Memory, Fainting, Headaches, Numbness, Seizures, Tingling, Tremor, Trouble walking and Weakness. Psychiatric Present- Anxiety. Not Present- Bipolar, Change in Sleep Pattern, Depression, Fearful and Frequent crying. Endocrine Not Present- Cold Intolerance, Excessive Hunger, Hair Changes, Heat Intolerance, Hot flashes and New Diabetes. Hematology Not Present- Blood Thinners, Easy Bruising, Excessive bleeding, Gland problems, HIV and Persistent Infections.  Vitals (Tanisha A. Brown RMA; 04/06/2017 9:26 AM) 04/06/2017 9:25 AM Weight: 201.8 lb Height: 70in Body Surface Area: 2.1 m Body Mass Index: 28.95 kg/m  Temp.: 98.11F  Pulse: 78 (Regular)  BP: 122/86 (Sitting, Left Arm, Standard)      Physical Exam  (Virda Betters A. Jai Steil MD; 04/06/2017 11:06 AM)  General Mental Status-Alert. General Appearance-Consistent with stated age. Hydration-Well hydrated. Voice-Normal.  Integumentary Note: One similar open wound right lateral thigh. No evidence of satellite lesions.  Eye Eyeball - Bilateral-Extraocular movements intact. Sclera/Conjunctiva -  Bilateral-No scleral icterus.  Neurologic Neurologic evaluation reveals -alert and oriented x 3 with no impairment of recent or remote memory. Mental Status-Normal.  Musculoskeletal Normal Exam - Left-Upper Extremity Strength Normal and Lower Extremity Strength Normal. Normal Exam - Right-Upper Extremity Strength Normal and Lower Extremity Strength Normal.  Lymphatic Femoral & Inguinal  Generalized Femoral & Inguinal Lymphatics: Bilateral - Description - Normal. Tenderness - Non Tender.    Assessment & Plan (Martino Tompson A. Leanthony Rhett MD; 04/06/2017 11:07 AM)  MELANOMA (C43.9) Impression: Discussed treatment options and recommended a wide local excision of his right thigh melanoma. Discussed a 1 cm margin which is the guideline currently for any melanoma less than 1 mm. Discuss sentinel lymph node mapping with him as well given the thickness was more than 8/10 mm. Risks, benefits and other options discussed. Potential long-term outcomes discussed. Wound complications and operative complications discussed. Risk of bleeding, infection, wound breakdown, large scar lymphedema, nerve injury, blood vessel injury need further operations and/or procedures and/or treatments discussed with the patient. Long-term expectations recovery discussed.  Current Plans Pt Education - Melanoma: cancer You are being scheduled for surgery- Our schedulers will call you.  You should hear from our office's scheduling department within 5 working days about the location, date, and time of surgery. We try to make accommodations for patient's preferences in  scheduling surgery, but sometimes the OR schedule or the surgeon's schedule prevents [place] from making those accommodations.  If you have not heard from our office [phone]) in 5 working days, call the office and ask for your surgeon's nurse.  If you have other questions about your diagnosis, plan, or surgery, call the office and ask for your surgeon's nurse.

## 2017-04-08 ENCOUNTER — Encounter (HOSPITAL_COMMUNITY): Payer: Self-pay | Admitting: *Deleted

## 2017-04-08 ENCOUNTER — Other Ambulatory Visit: Payer: Self-pay

## 2017-04-08 MED ORDER — DEXTROSE 5 % IV SOLN
3.0000 g | INTRAVENOUS | Status: AC
  Administered 2017-04-09: 3 g via INTRAVENOUS
  Filled 2017-04-08: qty 3

## 2017-04-08 NOTE — Progress Notes (Signed)
Pt denies SOB, chest pain, and being under the care of a cardiologist. Pt denies having a cardiac cath but stated that a stress test and echo were both performed > 10 years ago " around 2008." Pt denies having recent labs and a chest x ray within the last year but stated that an EKG may have been performed  "during my physical last March." Records requested from [facility], Dr. Melford Aase, PCP. Pt made aware to stop taking Aspirin, vitamins, fish oil and herbal medications. Do not take any NSAIDs ie: Ibuprofen, Advil, Naproxen (Aleve), Motrin, BC and Goody Powder or any medication containing Aspirin. Pt verbalized understanding of all pre-op instructions.

## 2017-04-09 ENCOUNTER — Ambulatory Visit (HOSPITAL_COMMUNITY): Payer: BLUE CROSS/BLUE SHIELD | Admitting: Anesthesiology

## 2017-04-09 ENCOUNTER — Ambulatory Visit (HOSPITAL_COMMUNITY)
Admission: RE | Admit: 2017-04-09 | Discharge: 2017-04-09 | Disposition: A | Discharge status: Home / Self Care | Payer: BLUE CROSS/BLUE SHIELD | Source: Ambulatory Visit | Attending: Surgery | Admitting: Surgery

## 2017-04-09 ENCOUNTER — Other Ambulatory Visit: Payer: Self-pay

## 2017-04-09 ENCOUNTER — Encounter (HOSPITAL_COMMUNITY): Payer: Self-pay | Admitting: *Deleted

## 2017-04-09 ENCOUNTER — Encounter (HOSPITAL_COMMUNITY)
Admission: RE | Disposition: A | Discharge status: Home / Self Care | Payer: Self-pay | Source: Ambulatory Visit | Attending: Surgery

## 2017-04-09 DIAGNOSIS — F419 Anxiety disorder, unspecified: Secondary | ICD-10-CM | POA: Diagnosis not present

## 2017-04-09 DIAGNOSIS — C4371 Malignant melanoma of right lower limb, including hip: Secondary | ICD-10-CM | POA: Diagnosis not present

## 2017-04-09 DIAGNOSIS — Z8249 Family history of ischemic heart disease and other diseases of the circulatory system: Secondary | ICD-10-CM | POA: Insufficient documentation

## 2017-04-09 DIAGNOSIS — K219 Gastro-esophageal reflux disease without esophagitis: Secondary | ICD-10-CM | POA: Insufficient documentation

## 2017-04-09 DIAGNOSIS — C439 Malignant melanoma of skin, unspecified: Secondary | ICD-10-CM

## 2017-04-09 DIAGNOSIS — J45909 Unspecified asthma, uncomplicated: Secondary | ICD-10-CM | POA: Diagnosis not present

## 2017-04-09 HISTORY — DX: Gastro-esophageal reflux disease without esophagitis: K21.9

## 2017-04-09 HISTORY — DX: Malignant melanoma of skin, unspecified: C43.9

## 2017-04-09 HISTORY — DX: Headache, unspecified: R51.9

## 2017-04-09 HISTORY — DX: Pneumonia, unspecified organism: J18.9

## 2017-04-09 HISTORY — DX: Personal history of urinary calculi: Z87.442

## 2017-04-09 HISTORY — PX: MELANOMA EXCISION WITH SENTINEL LYMPH NODE BIOPSY: SHX5267

## 2017-04-09 HISTORY — DX: Allergy status to unspecified drugs, medicaments and biological substances: Z88.9

## 2017-04-09 HISTORY — DX: Presence of spectacles and contact lenses: Z97.3

## 2017-04-09 HISTORY — DX: Headache: R51

## 2017-04-09 LAB — BASIC METABOLIC PANEL
Anion gap: 12 (ref 5–15)
BUN: 15 mg/dL (ref 6–20)
CO2: 23 mmol/L (ref 22–32)
CREATININE: 0.98 mg/dL (ref 0.61–1.24)
Calcium: 9.1 mg/dL (ref 8.9–10.3)
Chloride: 103 mmol/L (ref 101–111)
Glucose, Bld: 99 mg/dL (ref 65–99)
POTASSIUM: 3.9 mmol/L (ref 3.5–5.1)
SODIUM: 138 mmol/L (ref 135–145)

## 2017-04-09 LAB — HEMOGLOBIN: Hemoglobin: 15.2 g/dL (ref 13.0–17.0)

## 2017-04-09 SURGERY — MELANOMA EXCISION WITH SENTINEL LYMPH NODE BIOPSY
Anesthesia: General | Site: Thigh | Laterality: Right

## 2017-04-09 MED ORDER — MIDAZOLAM HCL 2 MG/2ML IJ SOLN
2.0000 mg | Freq: Once | INTRAMUSCULAR | Status: AC
  Administered 2017-04-09: 2 mg via INTRAVENOUS

## 2017-04-09 MED ORDER — LIDOCAINE HCL (CARDIAC) 20 MG/ML IV SOLN
INTRAVENOUS | Status: DC | PRN
  Administered 2017-04-09: 60 mg via INTRAVENOUS

## 2017-04-09 MED ORDER — BUPIVACAINE-EPINEPHRINE (PF) 0.5% -1:200000 IJ SOLN
INTRAMUSCULAR | Status: AC
  Filled 2017-04-09: qty 30

## 2017-04-09 MED ORDER — METHYLENE BLUE 0.5 % INJ SOLN
INTRAVENOUS | Status: AC
  Filled 2017-04-09: qty 10

## 2017-04-09 MED ORDER — HYDROMORPHONE HCL 1 MG/ML IJ SOLN: 0.2500 mg | INTRAMUSCULAR | Status: DC | PRN

## 2017-04-09 MED ORDER — IBUPROFEN 800 MG PO TABS: 800.0000 mg | ORAL_TABLET | Freq: Three times a day (TID) | ORAL | 0 refills | Status: AC | PRN

## 2017-04-09 MED ORDER — FENTANYL CITRATE (PF) 250 MCG/5ML IJ SOLN
INTRAMUSCULAR | Status: AC
Start: 2017-04-09 — End: ?
  Filled 2017-04-09: qty 5

## 2017-04-09 MED ORDER — CHLORHEXIDINE GLUCONATE CLOTH 2 % EX PADS: 6.0000 | MEDICATED_PAD | Freq: Once | CUTANEOUS | Status: DC

## 2017-04-09 MED ORDER — ONDANSETRON HCL 4 MG/2ML IJ SOLN
INTRAMUSCULAR | Status: DC | PRN
  Administered 2017-04-09: 4 mg via INTRAVENOUS

## 2017-04-09 MED ORDER — 0.9 % SODIUM CHLORIDE (POUR BTL) OPTIME
TOPICAL | Status: DC | PRN
  Administered 2017-04-09: 1000 mL

## 2017-04-09 MED ORDER — MIDAZOLAM HCL 2 MG/2ML IJ SOLN
INTRAMUSCULAR | Status: AC
  Administered 2017-04-09: 2 mg via INTRAVENOUS
  Filled 2017-04-09: qty 2

## 2017-04-09 MED ORDER — FENTANYL CITRATE (PF) 100 MCG/2ML IJ SOLN
INTRAMUSCULAR | Status: DC | PRN
  Administered 2017-04-09: 50 ug via INTRAVENOUS

## 2017-04-09 MED ORDER — ACETAMINOPHEN 500 MG PO TABS
1000.0000 mg | ORAL_TABLET | ORAL | Status: AC
  Administered 2017-04-09: 1000 mg via ORAL
  Filled 2017-04-09: qty 2

## 2017-04-09 MED ORDER — CELECOXIB 200 MG PO CAPS
200.0000 mg | ORAL_CAPSULE | ORAL | Status: AC
  Administered 2017-04-09: 200 mg via ORAL
  Filled 2017-04-09: qty 1

## 2017-04-09 MED ORDER — MIDAZOLAM HCL 2 MG/2ML IJ SOLN
INTRAMUSCULAR | Status: AC
  Filled 2017-04-09: qty 2

## 2017-04-09 MED ORDER — LACTATED RINGERS IV SOLN
INTRAVENOUS | Status: DC | PRN
  Administered 2017-04-09: 12:00:00 via INTRAVENOUS

## 2017-04-09 MED ORDER — OXYCODONE HCL 5 MG PO TABS: 5.0000 mg | ORAL_TABLET | Freq: Four times a day (QID) | ORAL | 0 refills | Status: AC | PRN

## 2017-04-09 MED ORDER — PROPOFOL 10 MG/ML IV BOLUS
INTRAVENOUS | Status: AC
  Filled 2017-04-09: qty 20

## 2017-04-09 MED ORDER — GABAPENTIN 300 MG PO CAPS
300.0000 mg | ORAL_CAPSULE | ORAL | Status: AC
  Administered 2017-04-09: 300 mg via ORAL
  Filled 2017-04-09: qty 1

## 2017-04-09 MED ORDER — PROPOFOL 10 MG/ML IV BOLUS
INTRAVENOUS | Status: DC | PRN
  Administered 2017-04-09: 200 mg via INTRAVENOUS

## 2017-04-09 MED ORDER — DEXAMETHASONE SODIUM PHOSPHATE 10 MG/ML IJ SOLN
INTRAMUSCULAR | Status: DC | PRN
  Administered 2017-04-09: 10 mg via INTRAVENOUS

## 2017-04-09 MED ORDER — TECHNETIUM TC 99M SULFUR COLLOID FILTERED
0.5000 | Freq: Once | INTRAVENOUS | Status: AC | PRN
  Administered 2017-04-09: 0.5 via INTRADERMAL

## 2017-04-09 MED ORDER — FENTANYL CITRATE (PF) 100 MCG/2ML IJ SOLN
INTRAMUSCULAR | Status: AC
  Administered 2017-04-09: 100 ug via INTRAVENOUS
  Filled 2017-04-09: qty 2

## 2017-04-09 MED ORDER — SODIUM CHLORIDE 0.9 % IJ SOLN
INTRAMUSCULAR | Status: AC
  Filled 2017-04-09: qty 10

## 2017-04-09 MED ORDER — MIDAZOLAM HCL 5 MG/5ML IJ SOLN
INTRAMUSCULAR | Status: DC | PRN
  Administered 2017-04-09: 2 mg via INTRAVENOUS

## 2017-04-09 MED ORDER — METHYLENE BLUE 0.5 % INJ SOLN
INTRAVENOUS | Status: DC | PRN
  Administered 2017-04-09: 1 mL

## 2017-04-09 MED ORDER — FENTANYL CITRATE (PF) 100 MCG/2ML IJ SOLN
100.0000 ug | Freq: Once | INTRAMUSCULAR | Status: AC
  Administered 2017-04-09: 100 ug via INTRAVENOUS

## 2017-04-09 MED ORDER — BUPIVACAINE-EPINEPHRINE (PF) 0.5% -1:200000 IJ SOLN
INTRAMUSCULAR | Status: DC | PRN
  Administered 2017-04-09: 26 mL

## 2017-04-09 SURGICAL SUPPLY — 47 items
BLADE SURG 15 STRL LF DISP TIS (BLADE) ×1 IMPLANT
BLADE SURG 15 STRL SS (BLADE) ×1
CANISTER SUCT 3000ML PPV (MISCELLANEOUS) ×2 IMPLANT
CHLORAPREP W/TINT 26ML (MISCELLANEOUS) ×2 IMPLANT
COVER PROBE W GEL 5X96 (DRAPES) ×4 IMPLANT
COVER SURGICAL LIGHT HANDLE (MISCELLANEOUS) ×2 IMPLANT
DERMABOND ADVANCED (GAUZE/BANDAGES/DRESSINGS) ×1
DERMABOND ADVANCED .7 DNX12 (GAUZE/BANDAGES/DRESSINGS) ×1 IMPLANT
DRAPE LAPAROSCOPIC ABDOMINAL (DRAPES) ×2 IMPLANT
DRAPE UTILITY XL STRL (DRAPES) ×4 IMPLANT
ELECT CAUTERY BLADE 6.4 (BLADE) ×2 IMPLANT
ELECT REM PT RETURN 9FT ADLT (ELECTROSURGICAL) ×2
ELECTRODE REM PT RTRN 9FT ADLT (ELECTROSURGICAL) ×1 IMPLANT
GAUZE SPONGE 4X4 12PLY STRL (GAUZE/BANDAGES/DRESSINGS) ×2 IMPLANT
GLOVE BIO SURGEON STRL SZ8 (GLOVE) ×2 IMPLANT
GLOVE BIOGEL PI IND STRL 6.5 (GLOVE) ×2 IMPLANT
GLOVE BIOGEL PI IND STRL 8 (GLOVE) ×1 IMPLANT
GLOVE BIOGEL PI INDICATOR 6.5 (GLOVE) ×2
GLOVE BIOGEL PI INDICATOR 8 (GLOVE) ×1
GOWN STRL REUS W/ TWL LRG LVL3 (GOWN DISPOSABLE) ×1 IMPLANT
GOWN STRL REUS W/ TWL XL LVL3 (GOWN DISPOSABLE) ×1 IMPLANT
GOWN STRL REUS W/TWL LRG LVL3 (GOWN DISPOSABLE) ×1
GOWN STRL REUS W/TWL XL LVL3 (GOWN DISPOSABLE) ×1
KIT BASIN OR (CUSTOM PROCEDURE TRAY) ×2 IMPLANT
KIT TURNOVER KIT B (KITS) ×2 IMPLANT
NEEDLE 18GX1X1/2 (RX/OR ONLY) (NEEDLE) ×2 IMPLANT
NEEDLE FILTER BLUNT 18X 1/2SAF (NEEDLE) ×1
NEEDLE FILTER BLUNT 18X1 1/2 (NEEDLE) ×1 IMPLANT
NEEDLE HYPO 25GX1X1/2 BEV (NEEDLE) ×2 IMPLANT
NS IRRIG 1000ML POUR BTL (IV SOLUTION) ×2 IMPLANT
PACK SURGICAL SETUP 50X90 (CUSTOM PROCEDURE TRAY) ×2 IMPLANT
PAD ARMBOARD 7.5X6 YLW CONV (MISCELLANEOUS) ×4 IMPLANT
PENCIL BUTTON HOLSTER BLD 10FT (ELECTRODE) ×2 IMPLANT
SPONGE LAP 4X18 X RAY DECT (DISPOSABLE) ×2 IMPLANT
SUT ETHILON 3 0 FSL (SUTURE) IMPLANT
SUT MNCRL AB 3-0 PS2 18 (SUTURE) ×2 IMPLANT
SUT MON AB 4-0 PC3 18 (SUTURE) ×2 IMPLANT
SUT SILK 2 0 SH (SUTURE) IMPLANT
SUT VIC AB 0 CT1 27 (SUTURE) ×1
SUT VIC AB 0 CT1 27XBRD ANBCTR (SUTURE) ×1 IMPLANT
SUT VIC AB 3-0 SH 27 (SUTURE) ×2
SUT VIC AB 3-0 SH 27X BRD (SUTURE) ×1 IMPLANT
SUT VIC AB 3-0 SH 27XBRD (SUTURE) ×1 IMPLANT
SYR CONTROL 10ML LL (SYRINGE) ×4 IMPLANT
TOWEL GREEN STERILE (TOWEL DISPOSABLE) ×2 IMPLANT
TUBE CONNECTING 12X1/4 (SUCTIONS) ×2 IMPLANT
YANKAUER SUCT BULB TIP NO VENT (SUCTIONS) ×2 IMPLANT

## 2017-04-09 NOTE — Transfer of Care (Signed)
Immediate Anesthesia Transfer of Care Note  Patient: Jerry Ayala  Procedure(s) Performed: WIDE EXCISION MELANOMA RIGHT THIGH WITH SENTINEL LYMPH NODE BIOPSY (Right Thigh)  Patient Location: PACU  Anesthesia Type:General  Level of Consciousness: awake, alert , oriented and patient cooperative  Airway & Oxygen Therapy: Patient Spontanous Breathing and Patient connected to nasal cannula oxygen  Post-op Assessment: Report given to RN and Post -op Vital signs reviewed and stable  Post vital signs: Reviewed and stable  Last Vitals:  Vitals:   04/09/17 1153 04/09/17 1500  BP: 138/75   Pulse: 78   Resp: 18 (P) 11  Temp: 36.8 C (P) 36.8 C  SpO2: 100%     Last Pain:  Vitals:   04/09/17 1153  TempSrc: Oral      Patients Stated Pain Goal: 4 ([id number]/41 [id number])  Complications: No apparent anesthesia complications

## 2017-04-09 NOTE — Interval H&P Note (Signed)
History and Physical Interval Note:  04/09/2017 1:09 PM  Jerry Ayala  has presented today for surgery, with the diagnosis of MELANOMA  The various methods of treatment have been discussed with the patient and family. After consideration of risks, benefits and other options for treatment, the patient has consented to  Procedure(s): WIDE EXCISION MELANOMA RIGHT THIGH WITH SENTINEL LYMPH NODE BIOPSY (Right) as a surgical intervention .  The patient's history has been reviewed, patient examined, no change in status, stable for surgery.  I have reviewed the patient's chart and labs.  Questions were answered to the patient's satisfaction.     [place]

## 2017-04-09 NOTE — Op Note (Signed)
Preoperative diagnosis: Right lateral thigh stage I melanoma  Postoperative diagnosis: Same  Procedure: Wide excision right thigh melanoma with intermediate closure measuring 6 cm and the right groin sentinel lymph node mapping with methylene blue dye  Surgeon: Erroll Luna MD  Anesthesia: LMA with local  EBL: 10 cc  Specimens: Right melanoma biopsy site with 1 cm margin full-thickness excision down to fascia of muscle and 1 sentinel node right groin hot and blue to pathology  Drains: None  IV fluids: Per anesthesia record  Indications for procedure: The patient is a 47 year old male with stage I superficial spreading right thigh melanoma diagnosed by his dermatologist.  He presents for wide excision and sentinel lymph node mapping due to a depth of invasion of 0.9 mm.  Risk, benefits and other options of treatment were discussed with the patient.  Postoperative expectations and recovery discussed with the patient.Sentinel lymph node mapping and dissection has been discussed with the patient.  Risk of bleeding,  Infection,  Seroma formation,  Additional procedures,,  Shoulder weakness ,  Shoulder stiffness,  Nerve and blood vessel injury and reaction to the mapping dyes have been discussed.  Alternatives to surgery have been discussed with the patient.  The patient agrees to proceed.The procedure has been discussed with the patient.  Alternative therapies have been discussed with the patient.  Operative risks include bleeding,  Infection,  Organ injury,  Nerve injury,  Blood vessel injury,  DVT,  Pulmonary embolism,  Death,  And possible reoperation.  Medical management risks include worsening of present situation.  The patient understands and agrees to proceed.  Description of procedure: The patient was met in the holding area.  The right thigh wound was marked and the patient underwent injection of technetium sulfur colloid by radiology.  The procedure was discussed as well as complications  expectations and postoperative care.  His questions were answered.  He was taken back to the operating room and placed supine on the operating room table.  After induction of LMA anesthesia the right thigh and right groin regions were prepped and draped in sterile fashion.  The neoprobe was used to help map out the lymph node drainage operative field.  After sterile prep and drape  And timeout, 1 cc of methylene blue dye was injected underneath the melanoma site.  This was massaged for 5 minutes.  Once the medial margin was marked around the wound.  Local anesthetic was infiltrated.  A wide excision which was full-thickness through the skin subcutaneous fat down to the fascia of the muscle was undertaken.  The fascia was not excised.  Full-thickness excision had a 1 cm margin was passed off the field.  This left about a 7 cm wound was closed with multiple layers the deep layer used to reapproximate the deep subcutaneous tissues.  This was 0 Vicryl.  3-0 Vicryl was used to approximate the subcutaneous dermal space.  4-0 Monocryl was used to close the skin.  Neoprobe was used.  I reexamined the inguinal region and  the lower femoral region and a hot spot was identified in the right femoral canal region.  Local anesthetic was infiltrated.  A 3 cm incision was made in the right  groin.  Dissection was carried down and  a hot blue sentinel node was identified.  This was anterior to the femoral sheath.  We did not have to enter the femoral sheath to find this.  Neoprobe was then used and no additional hot spots were identified in the  right groin, inguinal region or lower abdominal region on the right.  This wound was irrigated and closed in layers with a deep layer of 3-0 Vicryl and 4-0 Monocryl.  Dermabond applied to both sides.  All final counts were found to be correct sponge, needles and instruments.  Patient was awoke extubated taken to recovery in satisfactory condition.

## 2017-04-09 NOTE — Discharge Instructions (Signed)
Ok to shower Friday Wound will look blue due to dye No squating or lifting above 10 lbs of 2 - 3 weeks  Ok to drive when comfortable Ice packs As tolerated  Call office   [phone] for path or can text me at [phone]  Path should be back by Monday after 12   Eat and drink what you want If nauseated stick with clears Glue falls off in 2 - 3 weeks

## 2017-04-09 NOTE — Anesthesia Postprocedure Evaluation (Signed)
Anesthesia Post Note  Patient: Jerry Ayala  Procedure(s) Performed: WIDE EXCISION MELANOMA RIGHT THIGH WITH SENTINEL LYMPH NODE BIOPSY (Right Thigh)     Patient location during evaluation: PACU Anesthesia Type: General Level of consciousness: awake and alert Pain management: pain level controlled Vital Signs Assessment: post-procedure vital signs reviewed and stable Respiratory status: spontaneous breathing, nonlabored ventilation and respiratory function stable Cardiovascular status: blood pressure returned to baseline and stable Postop Assessment: no apparent nausea or vomiting Anesthetic complications: no    Last Vitals:  Vitals:   04/09/17 1527 04/09/17 1530  BP: 121/83   Pulse:  78  Resp:  15  Temp:  36.6 C  SpO2:  100%    Last Pain:  Vitals:   04/09/17 1530  TempSrc:   PainSc: 0-No pain                 Detra Bores,W. EDMOND

## 2017-04-09 NOTE — Anesthesia Preprocedure Evaluation (Addendum)
Anesthesia Evaluation  Patient identified by MRN, date of birth, ID band Patient awake    Reviewed: Allergy & Precautions, H&P , NPO status , Patient's Chart, lab work & pertinent test results  Airway Mallampati: II  TM Distance: >3 FB Neck ROM: Full    Dental no notable dental hx. (+) Teeth Intact, Dental Advisory Given   Pulmonary asthma ,    Pulmonary exam normal breath sounds clear to auscultation       Cardiovascular negative cardio ROS   Rhythm:Regular Rate:Normal     Neuro/Psych  Headaches, Anxiety negative psych ROS   GI/Hepatic negative GI ROS, Neg liver ROS, GERD  Controlled,  Endo/Other  negative endocrine ROS  Renal/GU negative Renal ROS  negative genitourinary   Musculoskeletal   Abdominal   Peds  Hematology negative hematology ROS (+)   Anesthesia Other Findings   Reproductive/Obstetrics negative OB ROS                            Anesthesia Physical Anesthesia Plan  ASA: II  Anesthesia Plan: General   Post-op Pain Management:    Induction: Intravenous  PONV Risk Score and Plan: 3 and Ondansetron, Dexamethasone and Midazolam  Airway Management Planned: LMA  Additional Equipment:   Intra-op Plan:   Post-operative Plan: Extubation in OR  Informed Consent: I have reviewed the patients History and Physical, chart, labs and discussed the procedure including the risks, benefits and alternatives for the proposed anesthesia with the patient or authorized representative who has indicated his/her understanding and acceptance.   Dental advisory given  Plan Discussed with: CRNA  Anesthesia Plan Comments:         Anesthesia Quick Evaluation

## 2017-04-10 ENCOUNTER — Encounter (HOSPITAL_COMMUNITY): Payer: Self-pay | Admitting: Surgery

## 2017-04-21 ENCOUNTER — Encounter: Payer: Self-pay | Admitting: Oncology

## 2017-04-21 ENCOUNTER — Telehealth: Payer: Self-pay | Admitting: Oncology

## 2017-04-21 NOTE — Telephone Encounter (Signed)
Appt has been scheduled for the pt to see Dr. Alen Blew on 2/26 at 2pm. Pt aware to arrive 30 minutes early. Address verified. Letter and directions mailed to the pt.

## 2017-04-21 NOTE — Telephone Encounter (Signed)
Appt has been scheduled for the pt to see Dr. Jana Hakim on 2/26 at 4pm. Pt aware to arrive 30 minutes early. Letter and directions mailed to the pt.

## 2017-05-12 ENCOUNTER — Telehealth: Payer: Self-pay

## 2017-05-12 ENCOUNTER — Inpatient Hospital Stay: Payer: BLUE CROSS/BLUE SHIELD | Attending: Oncology | Admitting: Oncology

## 2017-05-12 VITALS — BP 125/80 | HR 76 | Temp 98.9°F | Resp 18 | Ht 70.0 in | Wt 203.5 lb

## 2017-05-12 DIAGNOSIS — Z8042 Family history of malignant neoplasm of prostate: Secondary | ICD-10-CM

## 2017-05-12 DIAGNOSIS — C4371 Malignant melanoma of right lower limb, including hip: Secondary | ICD-10-CM

## 2017-05-12 DIAGNOSIS — C439 Malignant melanoma of skin, unspecified: Secondary | ICD-10-CM

## 2017-05-12 NOTE — Telephone Encounter (Signed)
Per 2/26 no los.

## 2017-05-12 NOTE — Progress Notes (Signed)
Reason for Referral: Melanoma. HPI: 47 year old gentleman native of [place] but lived in multiple locations and currently resides in [place] for the last 2 years.  He is a rather healthy gentleman with history of anxiety and asthma but no other comorbid conditions.  He is fair skinned and have had multiple moles in the past and have had strict dermatology follow-up in the past.  He does not have any personal history of melanoma.  He was then noted that to have a thigh lesion on the right lateral aspect.  He was evaluated by Dr. Ronnald Ramp from dermatology and a biopsy showed the presence of superficial spreading melanoma measuring 0.9 mm without ulceration.  Based on these findings, he was referred to Dr. Brantley Stage and underwent local wide excision and sentinel lymph node biopsy done on April 09, 2017.  The final pathology revealed no residual melanoma with no lymph node involvement.  He recovered reasonably well from his operation without any recent hospitalizations or complications.  He denies any family history of melanoma but his father and grandfather had prostate cancer and he gets screened periodically.  He remains active and continues to attend to activities of daily living without any decline.  He does not report any headaches, blurry vision, syncope or seizures. Does not report any fevers, chills or sweats.  Does not report any cough, wheezing or hemoptysis.  Does not report any chest pain, palpitation, orthopnea or leg edema.  Does not report any nausea, vomiting or abdominal pain.  She does not report any constipation or diarrhea.  Does not report any skeletal complaints.    Does not report frequency, urgency or hematuria.  Does not report any skin rashes or lesions. Does not report any heat or cold intolerance.  Does not report any lymphadenopathy or petechiae.    Remaining review of systems is negative.    Past Medical History:  Diagnosis Date  . Anxiety   . Asthma   . GERD  (gastroesophageal reflux disease)   . H/O seasonal allergies   . Headache   . History of kidney stones   . Melanoma ([place])    right thigh  . Pneumonia   . Wears contact lenses    and glasses  :  Past Surgical History:  Procedure Laterality Date  . HERNIA REPAIR     as a toddler  . KNEE ARTHROSCOPY    . MELANOMA EXCISION WITH SENTINEL LYMPH NODE BIOPSY Right 04/09/2017   Procedure: WIDE EXCISION MELANOMA RIGHT THIGH WITH SENTINEL LYMPH NODE BIOPSY;  Surgeon: Erroll Luna, MD;  Location: [facility];  Service: General;  Laterality: Right;  . ROTATOR CUFF REPAIR    . SHOULDER ARTHROSCOPY WITH SUBACROMIAL DECOMPRESSION Right 12/10/2015   Procedure: SHOULDER ARTHROSCOPY ROTATOR CUFF DEBRIDEMENT VS REPAIR WITH SUBACROMIAL DECOMPRESSION, DISTAL CLAVICAL RESECTION;  Surgeon: Tania Ade, MD;  Location: [facility];  Service: Orthopedics;  Laterality: Right;  SHOULDER ARTHROSCOPY ROTATOR CUFF DEBRIDEMENT VS REPAIR WITH SUBACROMIAL DECOMPRESSION, DISTAL CLAVICAL EXCISION, POSSIBLE BICEPS TENODESIS  :   Current Outpatient Medications:  .  acetaminophen (TYLENOL) 500 MG tablet, Take 500-1,000 mg by mouth every 6 (six) hours as needed for moderate pain or headache., Disp: , Rfl:  .  albuterol (PROVENTIL HFA;VENTOLIN HFA) 108 (90 Base) MCG/ACT inhaler, Inhale 2 puffs into the lungs every 6 (six) hours as needed for wheezing or shortness of breath., Disp: , Rfl:  .  docusate sodium (COLACE) 100 MG capsule, Take 1 capsule (100 mg total) by mouth 3 (three) times  daily as needed. (Patient not taking: Reported on 04/06/2017), Disp: 20 capsule, Rfl: 0 .  ibuprofen (ADVIL,MOTRIN) 800 MG tablet, Take 1 tablet (800 mg total) by mouth every 8 (eight) hours as needed., Disp: 30 tablet, Rfl: 0 .  Multiple Vitamins-Minerals (MULTIVITAMIN PO), Take 1 tablet by mouth 3 (three) times a week., Disp: , Rfl:  .  Omega-3 Fatty Acids (FISH OIL PO), Take 1 capsule by mouth 3 (three) times a week., Disp: , Rfl:   .  omeprazole (PRILOSEC) 40 MG capsule, Take 40 mg by mouth daily. , Disp: , Rfl:  .  oxyCODONE (OXY IR/ROXICODONE) 5 MG immediate release tablet, Take 1-2 tablets (5-10 mg total) by mouth every 6 (six) hours as needed for severe pain., Disp: 10 tablet, Rfl: 0 .  oxyCODONE-acetaminophen (ROXICET) 5-325 MG tablet, Take 1-2 tablets by mouth every 4 (four) hours as needed for severe pain. (Patient not taking: Reported on 04/06/2017), Disp: 60 tablet, Rfl: 0 .  sertraline (ZOLOFT) 50 MG tablet, Take 50 mg by mouth daily., Disp: , Rfl:  .  triamcinolone (NASACORT ALLERGY 24HR) 55 MCG/ACT AERO nasal inhaler, Place 1 spray into both nostrils daily. , Disp: , Rfl: :  No Known Allergies:  Family History  Problem Relation Age of Onset  . Glaucoma Mother   . High Cholesterol Mother   . Cancer Father   . Hypertension Father   . Diabetes Father   . Congestive Heart Failure Father   . High Cholesterol Father   :  Social History   Socioeconomic History  . Marital status: Married    Spouse name: Not on file  . Number of children: Not on file  . Years of education: Not on file  . Highest education level: Not on file  Social Needs  . Financial resource strain: Not on file  . Food insecurity - worry: Not on file  . Food insecurity - inability: Not on file  . Transportation needs - medical: Not on file  . Transportation needs - non-medical: Not on file  Occupational History  . Not on file  Tobacco Use  . Smoking status: Never Smoker  . Smokeless tobacco: Never Used  Substance and Sexual Activity  . Alcohol use: No  . Drug use: No  . Sexual activity: Not on file  Other Topics Concern  . Not on file  Social History Narrative  . Not on file  :  Pertinent items are noted in HPI.  Exam: Blood pressure 125/80, pulse 76, temperature 98.9 F (37.2 C), temperature source Oral, resp. rate 18, height 5\' 10"  (1.778 m), weight 203 lb 8 oz (92.3 kg), SpO2 100 %.  ECOG 0 General appearance:  Well-appearing gentleman appeared comfortable. Head: atraumatic without any abnormalities. Eyes: conjunctivae/corneas clear. PERRL.  Sclera anicteric. Throat: lips, mucosa, and tongue normal; without oral thrush or ulcers. Resp: clear to auscultation bilaterally without rhonchi, wheezes or dullness to percussion. Cardio: regular rate and rhythm, S1, S2 normal, no murmur, click, rub or gallop GI: soft, non-tender; bowel sounds normal; no masses,  no organomegaly Skin: Skin color, texture, turgor normal. No rashes or lesions.  Well-healed lesion on the right thigh. Lymph nodes: Cervical, supraclavicular, and axillary nodes normal. Neurologic: Grossly normal without any motor, sensory or deep tendon reflexes. Musculoskeletal: No joint deformity or effusion.  CBC    Component Value Date/Time   HGB 15.2 04/09/2017 1204     Chemistry      Component Value Date/Time   NA 138 04/09/2017 1204  K 3.9 04/09/2017 1204   CL 103 04/09/2017 1204   CO2 23 04/09/2017 1204   BUN 15 04/09/2017 1204   CREATININE 0.98 04/09/2017 1204      Component Value Date/Time   CALCIUM 9.1 04/09/2017 1204       Assessment and Plan:   47 year old man with the following issues:  1.  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  He presented with a 0.9 mm tumor and underwent local wide excision and lymph nodes sampling with the final pathological staging is T1bN0.  The natural course of this disease was discussed today with the patient in detail.  His risk of developing recurrent disease from this early stage disease is very low at this time and the role of adjuvant immunotherapy was discussed today and has no role in this early stage disease.  Active surveillance is recommended however moving forward.  I gave him the option of doing physical examination periodically as well as laboratory testing but given the fact that he has regular follow-up with his primary care  physician including annual labs and physical examination I feel any additional testing will be redundant.  He understands he is at risk of developing future skin cancers and potentially melanoma.  Additional staging workup and potentially adjuvant immunotherapy may be needed in the future if he develops more aggressive melanoma.  2.  Family history of prostate cancer: Given his diagnosis of melanoma as well this raises the risk of potential genetic syndrome associated with both malignancies.  I offered him genetic counseling referral and he will consider that option at this time.  3.  Skin protection: I recommended strategies to improve his skin care including decreasing sun exposure with wearing long sleeves, hats and sunblock.  I gave him my contact information and happy to see him in the future as needed.  30  minutes was spent with the patient face-to-face today.  More than 50% of time was dedicated to patient counseling, education and coordination of his multifaceted care.

## 2017-10-12 IMAGING — MR MR KNEE*L* W/O CM
5 series · 40 of 40 positions shown · non-contrast
Comparison: 10/12/2015

CLINICAL DATA: Left knee pain intermittently for years.  Swelling.

EXAM:
MRI OF THE LEFT KNEE WITHOUT CONTRAST
TECHNIQUE: Multiplanar, multisequence MR imaging of the knee was performed. No
intravenous contrast was administered.

[Series 2: PD · axial · 4.0mm · 0.50mm/px · z∈[-72,+38]mm · 10 of 24 slices shown (1 of 2)]
[im 1/24]
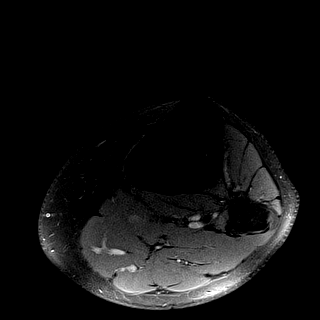
[im 3/24]
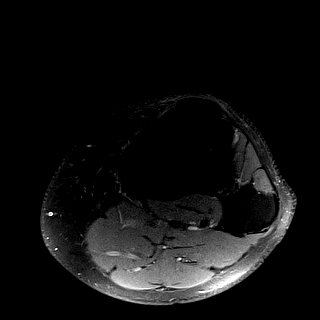
[im 6/24]
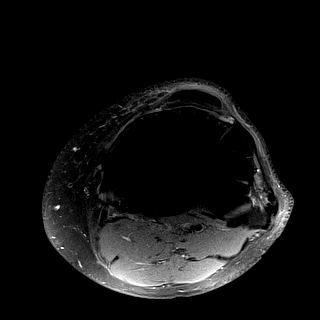
[im 8/24]
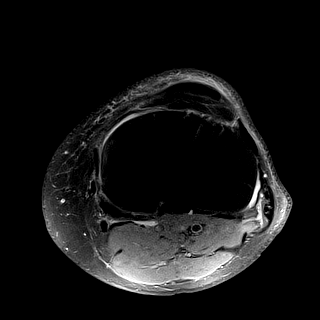
[im 11/24]
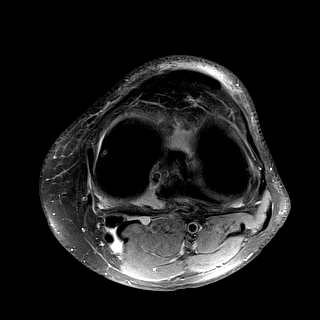
[im 13/24]
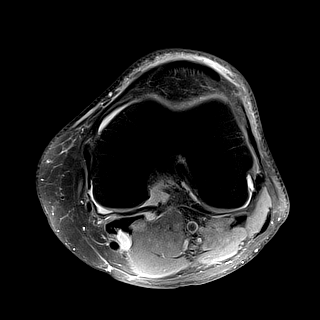
[im 16/24]
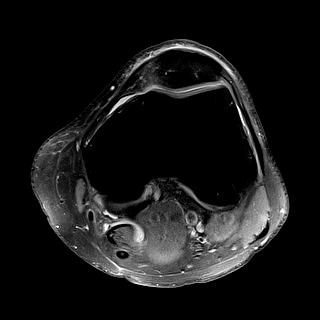
[im 18/24]
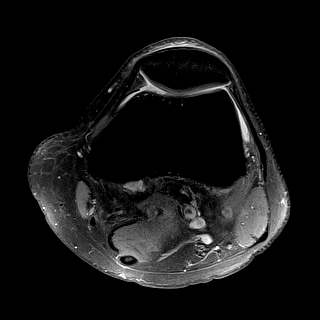
[im 21/24]
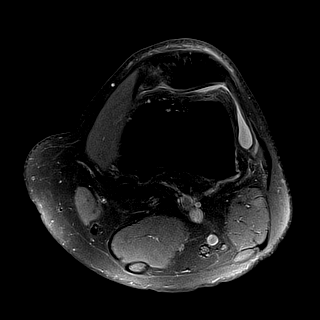
[im 24/24]
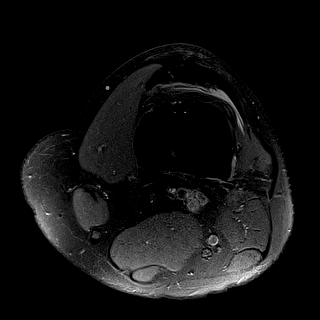

[Series 3: PD · coronal · 4.0mm · 0.53mm/px · 7 of 20 slices shown (2 of 2)]
[im 1/20]
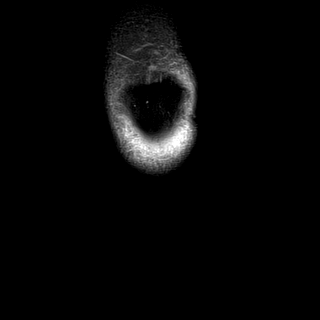
[im 4/20]
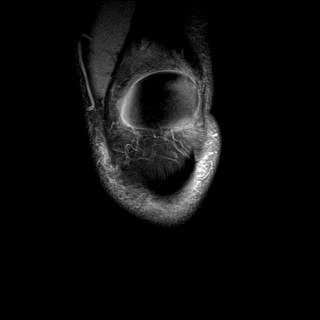
[im 7/20]
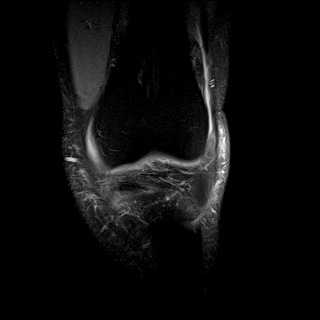
[im 10/20]
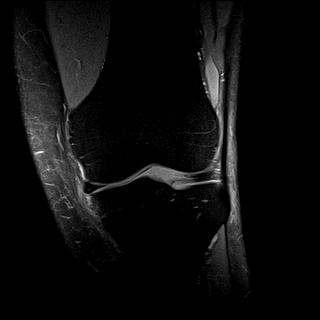
[im 13/20]
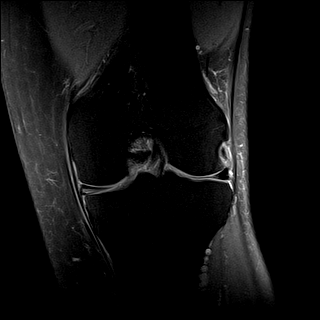
[im 16/20]
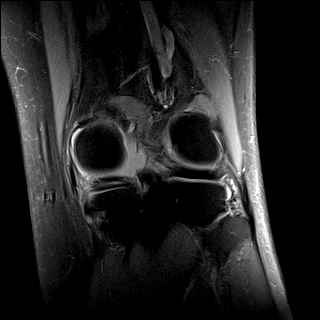
[im 20/20]
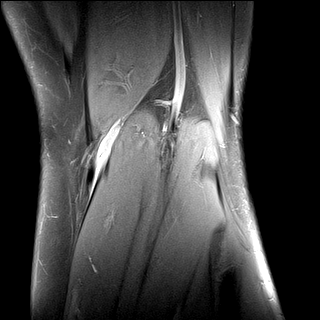

[Series 4: (id) · coronal · 4.0mm · 0.53mm/px · 7 of 20 slices shown]
[im 1/20]
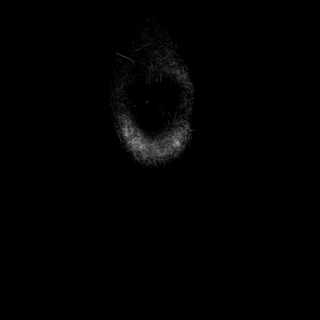
[im 4/20]
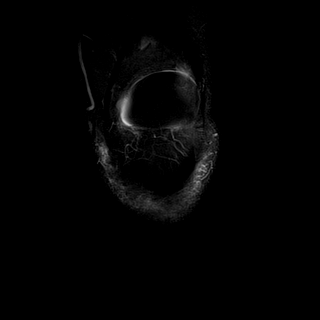
[im 7/20]
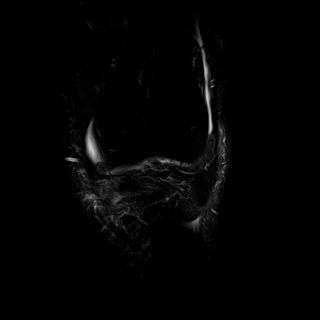
[im 10/20]
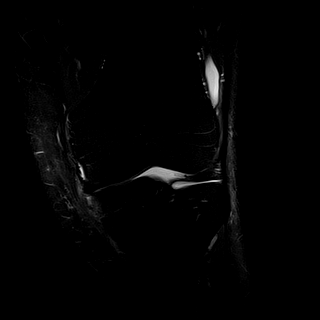
[im 13/20]
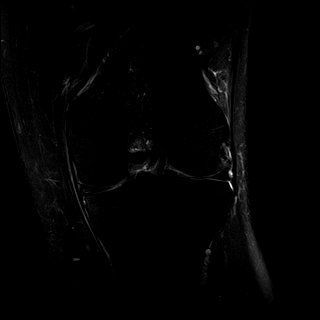
[im 16/20]
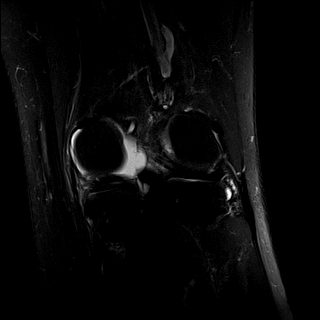
[im 20/20]
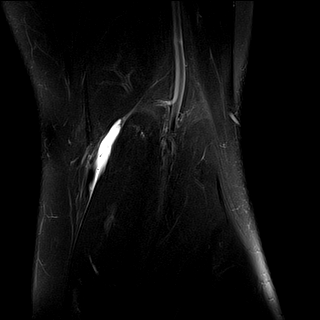

[Series 5: PD fat-sat · sagittal · 4.0mm · 0.50mm/px · 9 of 24 slices shown]
[im 1/24]
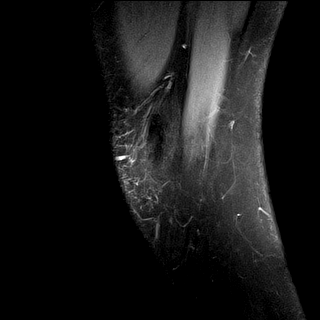
[im 3/24]
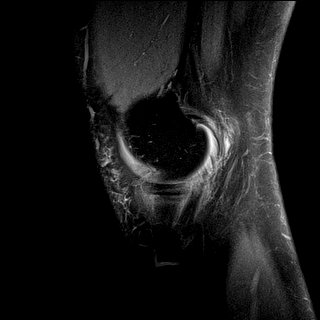
[im 6/24]
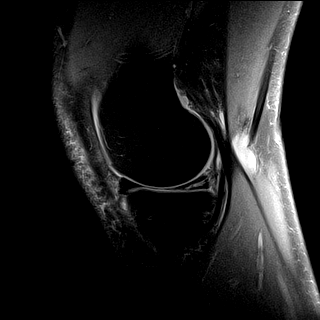
[im 9/24]
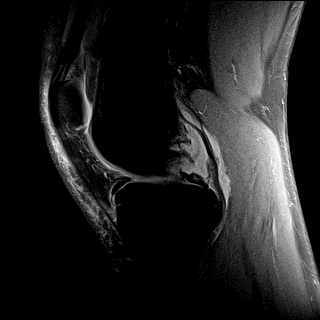
[im 12/24]
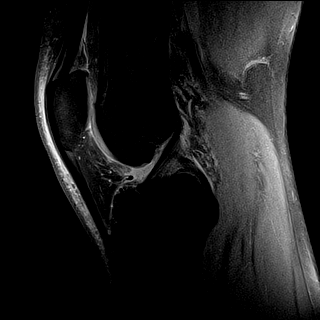
[im 15/24]
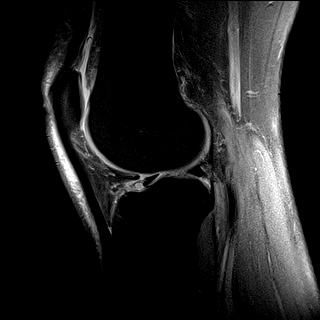
[im 18/24]
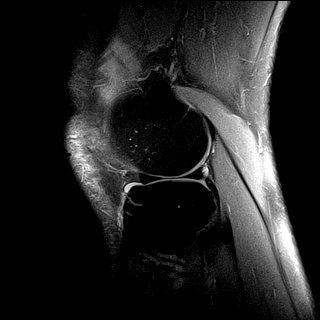
[im 21/24]
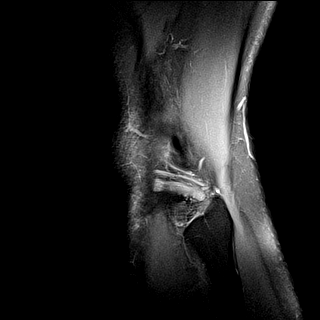
[im 24/24]
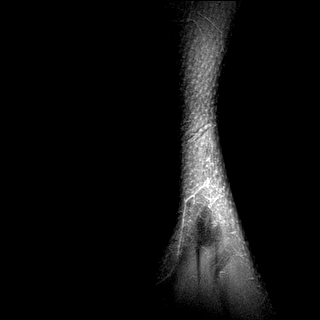

[Series 6: T1 · coronal · 4.0mm · 0.53mm/px · 7 of 20 slices shown]
[im 1/20]
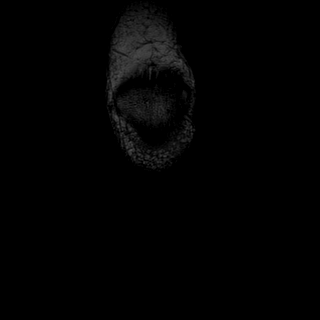
[im 4/20]
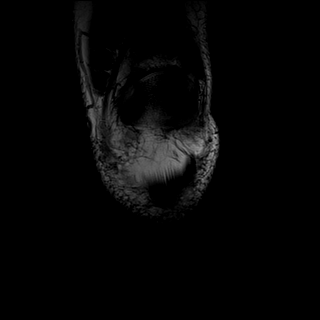
[im 7/20]
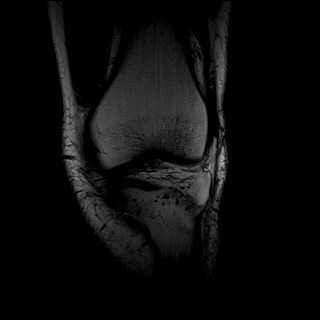
[im 10/20]
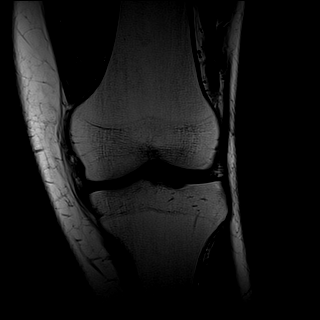
[im 13/20]
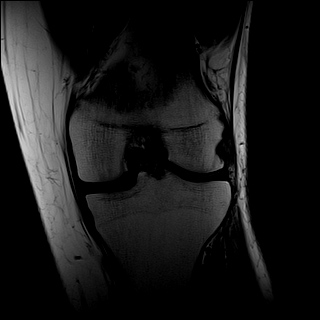
[im 16/20]
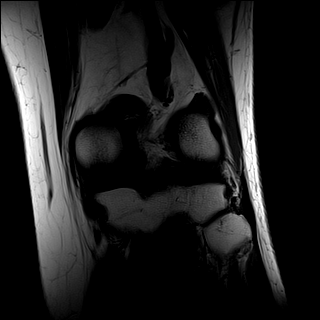
[im 20/20]
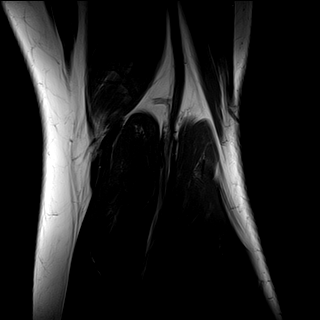

[40 of 40 positions shown; findings below may reference images not displayed]

FINDINGS: MENISCI

Medial meniscus: Large grade 3 oblique tear of the posterior horn
extends to the inferior meniscal surface, images 18-21 series 5.

Lateral meniscus: Small free edge tear of the midbody, image [DATE] and
image [DATE].

LIGAMENTS

Cruciates:  Unremarkable

Collaterals:  Unremarkable

CARTILAGE

Patellofemoral:  Unremarkable

Medial:  Mild to moderate but smooth degenerative chondral thinning.

Lateral: Mild to moderate degenerative chondral thinning, with mild
chondral heterogeneity posteriorly in the lateral tibial plateau.

Joint:  Trace knee effusion.

Popliteal Fossa:  Small Baker's cyst.  Trace pes anserine bursitis.

Extensor Mechanism: Subtle linear increased signal in the distal
quadriceps tendon is likely incidental but in the appropriate
clinical setting could represent a quadriceps sprain. Mild
prepatellar subcutaneous edema.

Bones: No significant extra-articular osseous abnormalities
identified.

Other: No supplemental non-categorized findings.
IMPRESSION: 1. Large grade 3 oblique tear of the posterior horn medial meniscus
involving the inferior meniscal surface.
2. Small focal free edge tear of the midbody lateral meniscus.
3. Subtle linear increased signal in the distal quadriceps tendon is
likely incidental but in the appropriate clinical setting could
represent a quadriceps sprain.
4. Mild prepatellar subcutaneous edema.
5. Mild to moderate chondral thinning in the medial and lateral
compartments.
6. Trace knee effusion with small Baker's cyst and trace pes
anserine bursitis.
